# Patient Record
Sex: Female | Born: 1995 | Race: Black or African American | Hispanic: No | Marital: Single | Smoking: Never smoker
Health system: Southern US, Community
[De-identification: ages and names within clinical notes are randomized; demographics above are authoritative.]

## PROBLEM LIST (undated history)

## (undated) DIAGNOSIS — Z789 Other specified health status: Secondary | ICD-10-CM

## (undated) HISTORY — PX: OTHER SURGICAL HISTORY: SHX169

---
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
## 2015-01-03 ENCOUNTER — Other Ambulatory Visit: Payer: Self-pay

## 2015-01-03 ENCOUNTER — Encounter: Payer: Self-pay | Admitting: Emergency Medicine

## 2015-01-03 ENCOUNTER — Emergency Department
Admission: EM | Admit: 2015-01-03 | Discharge: 2015-01-03 | Disposition: A | Discharge status: Home / Self Care | Payer: Medicaid Other | Attending: Emergency Medicine | Admitting: Emergency Medicine

## 2015-01-03 ENCOUNTER — Emergency Department: Payer: Medicaid Other

## 2015-01-03 DIAGNOSIS — R079 Chest pain, unspecified: Secondary | ICD-10-CM

## 2015-01-03 DIAGNOSIS — R0789 Other chest pain: Secondary | ICD-10-CM | POA: Insufficient documentation

## 2015-01-03 HISTORY — DX: Other specified health status: Z78.9

## 2015-01-03 LAB — TROPONIN I: Troponin I: <0.03 ng/mL (ref <0.031)

## 2015-01-03 LAB — BASIC METABOLIC PANEL
Anion gap: 8 (ref 5–15)
BUN: 12 mg/dL (ref 6–20)
CO2: 23 mmol/L (ref 22–32)
Calcium: 8.9 mg/dL (ref 8.9–10.3)
Chloride: 109 mmol/L (ref 101–111)
Creatinine, Ser: 0.73 mg/dL (ref 0.44–1.00)
GFR calc Af Amer: >60 mL/min (ref >60)
GLUCOSE: 102 mg/dL — AB (ref 65–99)
POTASSIUM: 3.6 mmol/L (ref 3.5–5.1)
SODIUM: 140 mmol/L (ref 135–145)

## 2015-01-03 LAB — CBC
HCT: 33.6 % — ABNORMAL LOW (ref 35.0–47.0)
HEMOGLOBIN: 10.7 g/dL — AB (ref 12.0–16.0)
MCH: 25.3 pg — ABNORMAL LOW (ref 26.0–34.0)
MCHC: 32 g/dL (ref 32.0–36.0)
MCV: 79.2 fL — AB (ref 80.0–100.0)
PLATELETS: 238 10*3/uL (ref 150–440)
RBC: 4.24 MIL/uL (ref 3.80–5.20)
RDW: 17.4 % — ABNORMAL HIGH (ref 11.5–14.5)
WBC: 5.7 10*3/uL (ref 3.6–11.0)

## 2015-01-03 MED ORDER — GI COCKTAIL ~~LOC~~
30.0000 mL | Freq: Once | ORAL | Status: AC
  Administered 2015-01-03: 30 mL via ORAL

## 2015-01-03 MED ORDER — RANITIDINE HCL 150 MG PO TABS: 150.0000 mg | ORAL_TABLET | Freq: Two times a day (BID) | ORAL | Status: AC

## 2015-01-03 MED ORDER — GI COCKTAIL ~~LOC~~
ORAL | Status: AC
  Administered 2015-01-03: 30 mL via ORAL
  Filled 2015-01-03: qty 30

## 2015-01-03 MED ORDER — SUCRALFATE 1 G PO TABS
1.0000 g | ORAL_TABLET | Freq: Four times a day (QID) | ORAL | Status: AC
Start: 2015-01-03 — End: 2016-01-03

## 2015-01-03 NOTE — ED Notes (Signed)
BIB [facility] from home d/t c/p for 2-3 days and SOB. Pt given 324 ASA by EMS en route. Pt denies history of cardiac problems. She describes sharp middle chest pain, does not radiate.

## 2015-01-03 NOTE — Discharge Instructions (Signed)
Please seek medical attention for any high fevers, chest pain, shortness of breath, change in behavior, persistent vomiting, bloody stool or any other new or concerning symptoms. ° °Chest Pain (Nonspecific) °It is often hard to give a specific diagnosis for the cause of chest pain. There is always a chance that your pain could be related to something serious, such as a heart attack or a blood clot in the lungs. You need to follow up with your health care provider for further evaluation. °CAUSES  °· Heartburn. °· Pneumonia or bronchitis. °· Anxiety or stress. °· Inflammation around your heart (pericarditis) or lung (pleuritis or pleurisy). °· A blood clot in the lung. °· A collapsed lung (pneumothorax). It can develop suddenly on its own (spontaneous pneumothorax) or from trauma to the chest. °· Shingles infection (herpes zoster virus). °The chest wall is composed of bones, muscles, and cartilage. Any of these can be the source of the pain. °· The bones can be bruised by injury. °· The muscles or cartilage can be strained by coughing or overwork. °· The cartilage can be affected by inflammation and become sore (costochondritis). °DIAGNOSIS  °Lab tests or other studies may be needed to find the cause of your pain. Your health care provider may have you take a test called an ambulatory electrocardiogram (ECG). An ECG records your heartbeat patterns over a 24-hour period. You may also have other tests, such as: °· Transthoracic echocardiogram (TTE). During echocardiography, sound waves are used to evaluate how blood flows through your heart. °· Transesophageal echocardiogram (TEE). °· Cardiac monitoring. This allows your health care provider to monitor your heart rate and rhythm in real time. °· Holter monitor. This is a portable device that records your heartbeat and can help diagnose heart arrhythmias. It allows your health care provider to track your heart activity for several days, if needed. °· Stress tests by  exercise or by giving medicine that makes the heart beat faster. °TREATMENT  °· Treatment depends on what may be causing your chest pain. Treatment may include: °¨ Acid blockers for heartburn. °¨ Anti-inflammatory medicine. °¨ Pain medicine for inflammatory conditions. °¨ Antibiotics if an infection is present. °· You may be advised to change lifestyle habits. This includes stopping smoking and avoiding alcohol, caffeine, and chocolate. °· You may be advised to keep your head raised (elevated) when sleeping. This reduces the chance of acid going backward from your stomach into your esophagus. °Most of the time, nonspecific chest pain will improve within 2-3 days with rest and mild pain medicine.  °HOME CARE INSTRUCTIONS  °· If antibiotics were prescribed, take them as directed. Finish them even if you start to feel better. °· For the next few days, avoid physical activities that bring on chest pain. Continue physical activities as directed. °· Do not use any tobacco products, including cigarettes, chewing tobacco, or electronic cigarettes. °· Avoid drinking alcohol. °· Only take medicine as directed by your health care provider. °· Follow your health care provider's suggestions for further testing if your chest pain does not go away. °· Keep any follow-up appointments you made. If you do not go to an appointment, you could develop lasting (chronic) problems with pain. If there is any problem keeping an appointment, call to reschedule. °SEEK MEDICAL CARE IF:  °· Your chest pain does not go away, even after treatment. °· You have a rash with blisters on your chest. °· You have a fever. °SEEK IMMEDIATE MEDICAL CARE IF:  °· You have increased chest   pain or pain that spreads to your arm, neck, jaw, back, or abdomen. °· You have shortness of breath. °· You have an increasing cough, or you cough up blood. °· You have severe back or abdominal pain. °· You feel nauseous or vomit. °· You have severe weakness. °· You  faint. °· You have chills. °This is an emergency. Do not wait to see if the pain will go away. Get medical help at once. Call your local emergency services (911 in [place] Do not drive yourself to the hospital. °MAKE SURE YOU:  °· Understand these instructions. °· Will watch your condition. °· Will get help right away if you are not doing well or get worse. °Document Released: 04/07/2005 Document Revised: 07/03/2013 Document Reviewed: 02/01/2008 °[employer]® Patient Information ©2015 [employer]. This information is not intended to replace advice given to you by your health care provider. Make sure you discuss any questions you have with your health care provider. ° °

## 2015-01-03 NOTE — ED Provider Notes (Signed)
[facility] Emergency Department Provider Note   ____________________________________________  Time seen: On EMS arrival  I have reviewed the triage vital signs and the nursing notes.   HISTORY  Chief Complaint Chest Pain   History limited by: Not Limited   HPI Melody Ortiz is a 19 y.o. female who presents to the emergency department today because of concerns for chest pain. It is been going on for 2-3 days. It is sharp. It is located in the central chest. It does not radiate. It has been constant. She denies any alleviating factors. She denies any exacerbating factors. She has had some associated shortness of breath. Denies any change with eating. Patient denies any fevers. Denies any cough. Denies any recent travel.     Past Medical History  Diagnosis Date  . Patient denies medical problems     There are no active problems to display for this patient.   Past Surgical History  Procedure Laterality Date  . Denies      No current outpatient prescriptions on file.  Allergies Review of patient's allergies indicates no known allergies.  History reviewed. No pertinent family history.  Social History History  Substance Use Topics  . Smoking status: Never Smoker   . Smokeless tobacco: Not on file  . Alcohol Use: No    Review of Systems  Constitutional: Negative for fever. Cardiovascular: Positive for chest pain. Respiratory: Positive for shortness of breath. Gastrointestinal: Negative for abdominal pain, vomiting and diarrhea. Genitourinary: Negative for dysuria. Musculoskeletal: Negative for back pain. Skin: Negative for rash. Neurological: Negative for headaches, focal weakness or numbness.   10-point ROS otherwise negative.  ____________________________________________   PHYSICAL EXAM:  VITAL SIGNS: ED Triage Vitals  Enc Vitals Group     BP 01/03/15 0958 132/64 mmHg     Pulse Rate 01/03/15 0958 69     Resp 01/03/15  0958 18     Temp 01/03/15 0958 98.7 F (37.1 C)     Temp Source 01/03/15 0958 Oral     SpO2 01/03/15 0958 100 %     Weight --      Height 01/03/15 0958  (1.727 m)     Head Cir --      Peak Flow --      Pain Score 01/03/15 0955 10   Constitutional: Alert and oriented. Well appearing and in no distress. Eyes: Conjunctivae are normal. PERRL. Normal extraocular movements. ENT   Head: Normocephalic and atraumatic.   Nose: No congestion/rhinnorhea.   Mouth/Throat: Mucous membranes are moist.   Neck: No stridor. Hematological/Lymphatic/Immunilogical: No cervical lymphadenopathy. Cardiovascular: Normal rate, regular rhythm.  No murmurs, rubs, or gallops. Respiratory: Normal respiratory effort without tachypnea nor retractions. Breath sounds are clear and equal bilaterally. No wheezes/rales/rhonchi. Gastrointestinal: Soft and nontender. No distention. There is no CVA tenderness. Genitourinary: Deferred Musculoskeletal: Normal range of motion in all extremities. No joint effusions.  No lower extremity tenderness nor edema. Neurologic:  Normal speech and language. No gross focal neurologic deficits are appreciated. Speech is normal.  Skin:  Skin is warm, dry and intact. No rash noted. Psychiatric: Mood and affect are normal. Speech and behavior are normal. Patient exhibits appropriate insight and judgment.  ____________________________________________    LABS (pertinent positives/negatives)  Labs Reviewed  CBC - Abnormal; Notable for the following:    Hemoglobin 10.7 (*)    HCT 33.6 (*)    MCV 79.2 (*)    MCH 25.3 (*)    RDW 17.4 (*)  All other components within normal limits  BASIC METABOLIC PANEL - Abnormal; Notable for the following:    Glucose, Bld 102 (*)    All other components within normal limits  TROPONIN I     ____________________________________________   EKG  I, Phineas Semen, attending physician, personally viewed and interpreted this  EKG  EKG Time: 0955 Rate: 68 Rhythm: Normal Sinus rhythm Axis: Normal Intervals: QTc 382 QRS: Narrow ST changes: No ST elevation    ____________________________________________    RADIOLOGY  Chest x-ray IMPRESSION: 1. No acute cardiopulmonary disease. 2. Low lung volumes. ____________________________________________   PROCEDURES  Procedure(s) performed: None  Critical Care performed: No  ____________________________________________   INITIAL IMPRESSION / ASSESSMENT AND PLAN / ED COURSE  Pertinent labs & imaging results that were available during my care of the patient were reviewed by me and considered in my medical decision making (see chart for details).  Patient presents to the emergency department today with complaints of chest pain. Pertinent negative. The patient low risk for ACS given lack of family history, risk factors. Will check chest x-ray and one set of troponin. EKG without concerning findings.  Blood work and chest x-ray without any concerning findings. Patient stated she felt better after the GI cocktail. Will discharge home with prescriptions for antacid and sucralfate.  ____________________________________________   FINAL CLINICAL IMPRESSION(S) / ED DIAGNOSES  Final diagnoses:  Chest pain, unspecified chest pain type     Phineas Semen, MD 01/03/15 1307
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
## 2017-01-06 IMAGING — CR DG CHEST 1V PORT
1 series · 1 of 1 positions shown · non-contrast
Comparison: One-view chest 07/13/2013

CLINICAL DATA: Progressive mid chest pain.

EXAM:
PORTABLE CHEST - 1 VIEW

[ap]
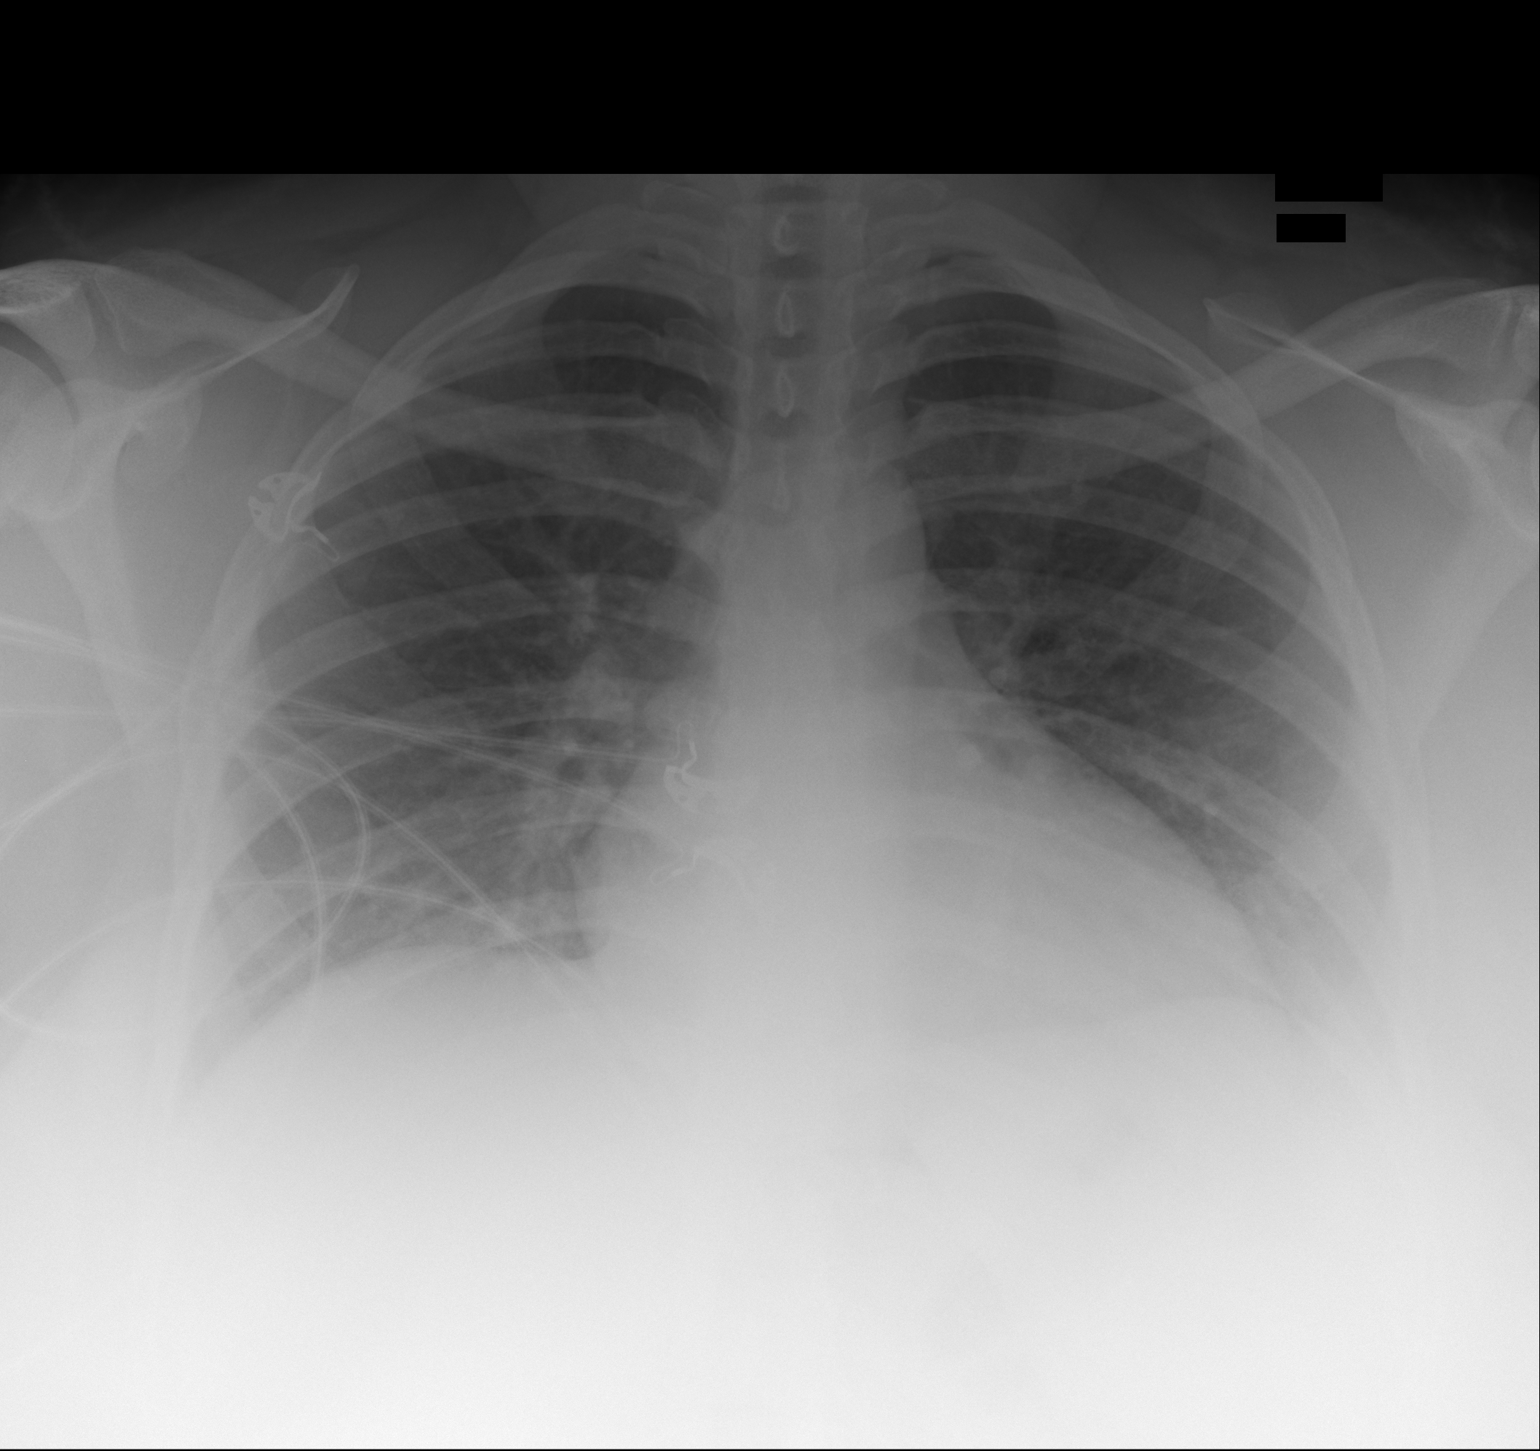

[1 of 1 positions shown; findings below may reference images not displayed]

FINDINGS: Low lung volumes are present. This exaggerates the pulmonary
interstitium and heart size. No focal airspace disease is evident.
IMPRESSION: 1. No acute cardiopulmonary disease.
2. Low lung volumes.
